# Patient Record
Sex: Female | Born: 1975 | Race: Black or African American | Hispanic: No | Marital: Single | Smoking: Never smoker
Health system: Southern US, Community
[De-identification: ages and names within clinical notes are randomized; demographics above are authoritative.]

## PROBLEM LIST (undated history)

## (undated) HISTORY — PX: ABDOMINAL HYSTERECTOMY: SHX81

---

## 2000-12-30 ENCOUNTER — Encounter: Payer: Self-pay | Admitting: Surgery

## 2000-12-30 ENCOUNTER — Encounter: Payer: Self-pay | Admitting: *Deleted

## 2000-12-30 ENCOUNTER — Inpatient Hospital Stay (HOSPITAL_COMMUNITY): Admission: AC | Admit: 2000-12-30 | Discharge: 2001-01-05 | Payer: Self-pay

## 2001-02-04 ENCOUNTER — Encounter: Payer: Self-pay | Admitting: Orthopaedic Surgery

## 2001-02-04 ENCOUNTER — Emergency Department (HOSPITAL_COMMUNITY): Admission: EM | Admit: 2001-02-04 | Discharge: 2001-02-04 | Payer: Self-pay | Admitting: Emergency Medicine

## 2001-02-04 ENCOUNTER — Encounter: Admission: RE | Admit: 2001-02-04 | Discharge: 2001-02-04 | Payer: Self-pay | Admitting: Orthopaedic Surgery

## 2001-02-10 ENCOUNTER — Encounter: Admission: RE | Admit: 2001-02-10 | Discharge: 2001-03-11 | Payer: Self-pay | Admitting: Orthopaedic Surgery

## 2003-04-17 ENCOUNTER — Emergency Department (HOSPITAL_COMMUNITY): Admission: EM | Admit: 2003-04-17 | Discharge: 2003-04-17 | Payer: Self-pay

## 2004-06-12 ENCOUNTER — Other Ambulatory Visit: Admission: RE | Admit: 2004-06-12 | Discharge: 2004-06-12 | Payer: Self-pay | Admitting: Obstetrics and Gynecology

## 2007-08-05 ENCOUNTER — Emergency Department (HOSPITAL_COMMUNITY): Admission: EM | Admit: 2007-08-05 | Discharge: 2007-08-05 | Payer: Self-pay | Admitting: Emergency Medicine

## 2007-08-14 ENCOUNTER — Emergency Department (HOSPITAL_COMMUNITY): Admission: EM | Admit: 2007-08-14 | Discharge: 2007-08-14 | Payer: Self-pay | Admitting: Emergency Medicine

## 2007-11-22 ENCOUNTER — Emergency Department (HOSPITAL_COMMUNITY): Admission: EM | Admit: 2007-11-22 | Discharge: 2007-11-23 | Payer: Self-pay | Admitting: Emergency Medicine

## 2007-12-04 ENCOUNTER — Emergency Department (HOSPITAL_COMMUNITY): Admission: EM | Admit: 2007-12-04 | Discharge: 2007-12-04 | Payer: Self-pay | Admitting: Emergency Medicine

## 2007-12-11 ENCOUNTER — Emergency Department (HOSPITAL_COMMUNITY): Admission: EM | Admit: 2007-12-11 | Discharge: 2007-12-11 | Payer: Self-pay | Admitting: Emergency Medicine

## 2008-01-30 ENCOUNTER — Emergency Department (HOSPITAL_COMMUNITY): Admission: EM | Admit: 2008-01-30 | Discharge: 2008-01-30 | Payer: Self-pay | Admitting: Emergency Medicine

## 2008-07-05 ENCOUNTER — Ambulatory Visit (HOSPITAL_COMMUNITY): Admission: RE | Admit: 2008-07-05 | Discharge: 2008-07-05 | Payer: Self-pay | Admitting: Obstetrics and Gynecology

## 2008-07-07 ENCOUNTER — Encounter: Admission: RE | Admit: 2008-07-07 | Discharge: 2008-08-04 | Payer: Self-pay | Admitting: Obstetrics and Gynecology

## 2008-07-25 ENCOUNTER — Ambulatory Visit: Payer: Self-pay | Admitting: Obstetrics & Gynecology

## 2008-07-25 LAB — CONVERTED CEMR LAB
MCV: 91.9 fL (ref 78.0–100.0)
WBC: 10.4 10*3/uL (ref 4.0–10.5)

## 2008-08-02 ENCOUNTER — Ambulatory Visit: Payer: Self-pay | Admitting: Family Medicine

## 2008-08-02 ENCOUNTER — Inpatient Hospital Stay (HOSPITAL_COMMUNITY): Admission: AD | Admit: 2008-08-02 | Discharge: 2008-08-02 | Payer: Self-pay | Admitting: Family Medicine

## 2008-08-04 ENCOUNTER — Ambulatory Visit: Payer: Self-pay | Admitting: Family Medicine

## 2008-08-04 ENCOUNTER — Encounter: Payer: Self-pay | Admitting: Obstetrics & Gynecology

## 2008-08-04 ENCOUNTER — Ambulatory Visit (HOSPITAL_COMMUNITY): Admission: RE | Admit: 2008-08-04 | Discharge: 2008-08-04 | Payer: Self-pay | Admitting: Obstetrics & Gynecology

## 2008-08-04 LAB — CONVERTED CEMR LAB
Benzodiazepines.: NEGATIVE
Creatinine,U: 67.3 mg/dL
Methadone: NEGATIVE
Opiate Screen, Urine: POSITIVE — AB
Propoxyphene: NEGATIVE

## 2008-08-12 ENCOUNTER — Ambulatory Visit (HOSPITAL_COMMUNITY): Admission: RE | Admit: 2008-08-12 | Discharge: 2008-08-12 | Payer: Self-pay | Admitting: Family Medicine

## 2008-08-13 ENCOUNTER — Encounter: Payer: Self-pay | Admitting: Obstetrics & Gynecology

## 2008-08-18 ENCOUNTER — Ambulatory Visit (HOSPITAL_COMMUNITY): Admission: RE | Admit: 2008-08-18 | Discharge: 2008-08-18 | Payer: Self-pay | Admitting: Obstetrics & Gynecology

## 2008-08-18 ENCOUNTER — Ambulatory Visit: Payer: Self-pay | Admitting: Obstetrics & Gynecology

## 2008-08-19 ENCOUNTER — Ambulatory Visit: Payer: Self-pay | Admitting: Obstetrics & Gynecology

## 2008-08-21 ENCOUNTER — Ambulatory Visit: Payer: Self-pay | Admitting: Obstetrics & Gynecology

## 2008-08-21 ENCOUNTER — Inpatient Hospital Stay (HOSPITAL_COMMUNITY): Admission: AD | Admit: 2008-08-21 | Discharge: 2008-08-21 | Payer: Self-pay | Admitting: Obstetrics & Gynecology

## 2008-11-24 ENCOUNTER — Emergency Department (HOSPITAL_COMMUNITY): Admission: EM | Admit: 2008-11-24 | Discharge: 2008-11-24 | Payer: Self-pay | Admitting: Emergency Medicine

## 2008-12-01 ENCOUNTER — Emergency Department (HOSPITAL_COMMUNITY): Admission: EM | Admit: 2008-12-01 | Discharge: 2008-12-01 | Payer: Self-pay | Admitting: Emergency Medicine

## 2009-01-31 ENCOUNTER — Emergency Department (HOSPITAL_COMMUNITY): Admission: EM | Admit: 2009-01-31 | Discharge: 2009-01-31 | Payer: Self-pay | Admitting: Family Medicine

## 2009-03-29 ENCOUNTER — Inpatient Hospital Stay (HOSPITAL_COMMUNITY): Admission: AD | Admit: 2009-03-29 | Discharge: 2009-03-29 | Payer: Self-pay | Admitting: Obstetrics & Gynecology

## 2009-04-12 ENCOUNTER — Ambulatory Visit: Payer: Self-pay | Admitting: Obstetrics & Gynecology

## 2009-05-11 ENCOUNTER — Inpatient Hospital Stay (HOSPITAL_COMMUNITY): Admission: RE | Admit: 2009-05-11 | Discharge: 2009-05-13 | Payer: Self-pay | Admitting: Obstetrics & Gynecology

## 2009-05-11 ENCOUNTER — Ambulatory Visit: Payer: Self-pay | Admitting: Obstetrics & Gynecology

## 2009-05-11 ENCOUNTER — Encounter: Payer: Self-pay | Admitting: Obstetrics & Gynecology

## 2009-05-17 ENCOUNTER — Inpatient Hospital Stay (HOSPITAL_COMMUNITY): Admission: AD | Admit: 2009-05-17 | Discharge: 2009-05-17 | Payer: Self-pay | Admitting: Obstetrics and Gynecology

## 2009-07-12 ENCOUNTER — Ambulatory Visit: Payer: Self-pay | Admitting: Obstetrics and Gynecology

## 2009-10-26 ENCOUNTER — Ambulatory Visit: Payer: Self-pay | Admitting: Obstetrics and Gynecology

## 2009-11-22 ENCOUNTER — Ambulatory Visit: Payer: Self-pay | Admitting: Internal Medicine

## 2009-12-12 ENCOUNTER — Ambulatory Visit: Payer: Self-pay | Admitting: Internal Medicine

## 2009-12-12 LAB — CONVERTED CEMR LAB
AST: 14 units/L (ref 0–37)
Albumin: 4.1 g/dL (ref 3.5–5.2)
Alkaline Phosphatase: 42 units/L (ref 39–117)
Basophils Absolute: 0.1 10*3/uL (ref 0.0–0.1)
CO2: 20 meq/L (ref 19–32)
Cholesterol: 145 mg/dL (ref 0–200)
Eosinophils Absolute: 0.3 10*3/uL (ref 0.0–0.7)
Eosinophils Relative: 7 % — ABNORMAL HIGH (ref 0–5)
HDL: 62 mg/dL (ref >39)
Lymphocytes Relative: 35 % (ref 12–46)
Lymphs Abs: 1.3 10*3/uL (ref 0.7–4.0)
MCV: 91 fL (ref 78.0–100.0)
Monocytes Absolute: 0.4 10*3/uL (ref 0.1–1.0)
Monocytes Relative: 9 % (ref 3–12)
Neutro Abs: 1.7 10*3/uL (ref 1.7–7.7)
Platelets: 288 10*3/uL (ref 150–400)
RBC: 4.13 M/uL (ref 3.87–5.11)
TSH: 1.291 microintl units/mL (ref 0.350–4.500)
Total Bilirubin: 1.1 mg/dL (ref 0.3–1.2)
Total CHOL/HDL Ratio: 2.3
Total Protein: 6.8 g/dL (ref 6.0–8.3)
Triglycerides: 35 mg/dL (ref <150)

## 2010-07-06 ENCOUNTER — Emergency Department (HOSPITAL_COMMUNITY): Admission: EM | Admit: 2010-07-06 | Discharge: 2010-07-06 | Payer: Self-pay | Admitting: Emergency Medicine

## 2010-09-22 ENCOUNTER — Encounter: Payer: Self-pay | Admitting: Obstetrics and Gynecology

## 2010-09-23 ENCOUNTER — Encounter: Payer: Self-pay | Admitting: *Deleted

## 2010-09-23 ENCOUNTER — Encounter: Payer: Self-pay | Admitting: Obstetrics and Gynecology

## 2010-09-23 ENCOUNTER — Encounter: Payer: Self-pay | Admitting: Obstetrics

## 2010-10-11 ENCOUNTER — Other Ambulatory Visit: Payer: Self-pay | Admitting: Family Medicine

## 2010-10-11 ENCOUNTER — Ambulatory Visit
Admission: RE | Admit: 2010-10-11 | Discharge: 2010-10-11 | Disposition: A | Discharge status: Home / Self Care | Payer: Medicaid Other | Source: Ambulatory Visit | Attending: Family Medicine | Admitting: Family Medicine

## 2010-10-11 DIAGNOSIS — J209 Acute bronchitis, unspecified: Secondary | ICD-10-CM

## 2010-10-11 DIAGNOSIS — J019 Acute sinusitis, unspecified: Secondary | ICD-10-CM

## 2010-10-14 ENCOUNTER — Emergency Department (HOSPITAL_COMMUNITY)
Admission: EM | Admit: 2010-10-14 | Discharge: 2010-10-14 | Disposition: A | Discharge status: Home / Self Care | Payer: Medicaid Other | Attending: Emergency Medicine | Admitting: Emergency Medicine

## 2010-10-14 DIAGNOSIS — J45909 Unspecified asthma, uncomplicated: Secondary | ICD-10-CM | POA: Insufficient documentation

## 2010-10-14 DIAGNOSIS — R093 Abnormal sputum: Secondary | ICD-10-CM | POA: Insufficient documentation

## 2010-10-14 DIAGNOSIS — R05 Cough: Secondary | ICD-10-CM | POA: Insufficient documentation

## 2010-10-14 DIAGNOSIS — R059 Cough, unspecified: Secondary | ICD-10-CM | POA: Insufficient documentation

## 2010-11-15 ENCOUNTER — Inpatient Hospital Stay: Admission: RE | Admit: 2010-11-15 | Payer: Medicaid Other | Source: Ambulatory Visit

## 2010-11-15 ENCOUNTER — Other Ambulatory Visit: Payer: Self-pay | Admitting: Otolaryngology

## 2010-11-15 DIAGNOSIS — J329 Chronic sinusitis, unspecified: Secondary | ICD-10-CM

## 2010-11-16 ENCOUNTER — Ambulatory Visit
Admission: RE | Admit: 2010-11-16 | Discharge: 2010-11-16 | Disposition: A | Discharge status: Home / Self Care | Payer: Medicaid Other | Source: Ambulatory Visit | Attending: Otolaryngology | Admitting: Otolaryngology

## 2010-11-16 DIAGNOSIS — J329 Chronic sinusitis, unspecified: Secondary | ICD-10-CM

## 2010-12-07 LAB — CBC
HCT: 24.7 % — ABNORMAL LOW (ref 36.0–46.0)
HCT: 27.2 % — ABNORMAL LOW (ref 36.0–46.0)
Hemoglobin: 11.4 g/dL — ABNORMAL LOW (ref 12.0–15.0)
Hemoglobin: 8.2 g/dL — ABNORMAL LOW (ref 12.0–15.0)
Hemoglobin: 9.1 g/dL — ABNORMAL LOW (ref 12.0–15.0)
MCHC: 33 g/dL (ref 30.0–36.0)
Platelets: 215 10*3/uL (ref 150–400)
Platelets: 259 10*3/uL (ref 150–400)
RDW: 13.2 % (ref 11.5–15.5)
WBC: 12.7 10*3/uL — ABNORMAL HIGH (ref 4.0–10.5)

## 2010-12-07 LAB — PREGNANCY, URINE: Preg Test, Ur: NEGATIVE

## 2010-12-09 LAB — COMPREHENSIVE METABOLIC PANEL
BUN: 14 mg/dL (ref 6–23)
CO2: 27 mEq/L (ref 19–32)
Calcium: 9.4 mg/dL (ref 8.4–10.5)
Creatinine, Ser: 0.88 mg/dL (ref 0.4–1.2)
GFR calc Af Amer: >60 mL/min (ref >60)
Glucose, Bld: 90 mg/dL (ref 70–99)

## 2010-12-09 LAB — URINALYSIS, ROUTINE W REFLEX MICROSCOPIC
Bilirubin Urine: NEGATIVE
Glucose, UA: NEGATIVE mg/dL
Hgb urine dipstick: NEGATIVE
Specific Gravity, Urine: >1.03 — ABNORMAL HIGH (ref 1.005–1.030)
Urobilinogen, UA: 0.2 mg/dL (ref 0.0–1.0)

## 2010-12-09 LAB — CBC
HCT: 34.7 % — ABNORMAL LOW (ref 36.0–46.0)
Hemoglobin: 11.6 g/dL — ABNORMAL LOW (ref 12.0–15.0)
MCHC: 33.5 g/dL (ref 30.0–36.0)
MCV: 88.8 fL (ref 78.0–100.0)
RBC: 3.91 MIL/uL (ref 3.87–5.11)

## 2010-12-13 ENCOUNTER — Ambulatory Visit: Payer: No Typology Code available for payment source | Admitting: Physical Therapy

## 2010-12-18 ENCOUNTER — Ambulatory Visit: Payer: No Typology Code available for payment source | Attending: Orthopedic Surgery | Admitting: Physical Therapy

## 2010-12-18 DIAGNOSIS — IMO0001 Reserved for inherently not codable concepts without codable children: Secondary | ICD-10-CM | POA: Insufficient documentation

## 2010-12-18 DIAGNOSIS — M25676 Stiffness of unspecified foot, not elsewhere classified: Secondary | ICD-10-CM | POA: Insufficient documentation

## 2010-12-18 DIAGNOSIS — M25579 Pain in unspecified ankle and joints of unspecified foot: Secondary | ICD-10-CM | POA: Insufficient documentation

## 2010-12-18 DIAGNOSIS — M25673 Stiffness of unspecified ankle, not elsewhere classified: Secondary | ICD-10-CM | POA: Insufficient documentation

## 2010-12-31 ENCOUNTER — Ambulatory Visit: Payer: No Typology Code available for payment source | Admitting: Physical Therapy

## 2011-01-02 ENCOUNTER — Ambulatory Visit: Payer: No Typology Code available for payment source | Attending: Orthopedic Surgery | Admitting: Physical Therapy

## 2011-01-02 DIAGNOSIS — M25676 Stiffness of unspecified foot, not elsewhere classified: Secondary | ICD-10-CM | POA: Insufficient documentation

## 2011-01-02 DIAGNOSIS — M25673 Stiffness of unspecified ankle, not elsewhere classified: Secondary | ICD-10-CM | POA: Insufficient documentation

## 2011-01-02 DIAGNOSIS — M25579 Pain in unspecified ankle and joints of unspecified foot: Secondary | ICD-10-CM | POA: Insufficient documentation

## 2011-01-02 DIAGNOSIS — IMO0001 Reserved for inherently not codable concepts without codable children: Secondary | ICD-10-CM | POA: Insufficient documentation

## 2011-01-07 ENCOUNTER — Ambulatory Visit: Payer: No Typology Code available for payment source | Admitting: Physical Therapy

## 2011-01-09 ENCOUNTER — Ambulatory Visit: Payer: No Typology Code available for payment source | Admitting: Physical Therapy

## 2011-01-14 ENCOUNTER — Ambulatory Visit: Payer: No Typology Code available for payment source | Admitting: Physical Therapy

## 2011-01-15 NOTE — Group Therapy Note (Signed)
NAME:  Jenna Aguirre, Jenna Aguirre            ACCOUNT NO.:  [id number]   MEDICAL RECORD NO.:  [id number]          PATIENT TYPE:  WOC   LOCATION:  [facility]                   FACILITY:  [facility]   PHYSICIAN:  Allie Bossier, MD        DATE OF BIRTH:  February 07, 1976   DATE OF SERVICE:                                  CLINIC NOTE   Jenna Aguirre is a 35 year old gravida 2, para 1, abortus 1, who is here to  schedule an abdominal hysterectomy.  She has a longstanding history of  large uterine fibroids and recently completed a pregnancy via an  emergency C-section at [facility].  Please note at that time during her  pregnancy, she was on chronic narcotic use for the pain from her fibroid  and she describes since the baby delivery chronic pelvic pain and  painful periods.   PAST MEDICAL HISTORY:  Anemia, asthma, and fibroids.   REVIEW OF SYSTEMS:  Noncontributory.   SURGERY:  She has a C-section with a vertical skin incision.  Please  note that she would like a transverse skin incision with her  hysterectomy.  She had a D and C, and tubal ligation, and laparoscopy in  1999.   No latex allergies.   DRUG ALLERGIES:  AMOXICILLIN and INDOMETHACIN.   FAMILY HISTORY:  Negative for breast, GYN, and colon malignancies.   SOCIAL HISTORY:  Negative as well.   PHYSICAL EXAMINATION:  VITAL SIGNS:  She is 5 feet 9, her weight is 162  pounds, blood pressure is 121/77, and her pulse is 110.  GENERAL:  She is afebrile.  HEENT:  Normal heart.  Regular rate and rhythm.  LUNGS:  Clear to auscultation bilaterally.  ABDOMEN:  Benign.  Her uterus is palpable to 15-week size.   ASSESSMENT AND PLAN:  Symptomatic uterine fibroids with anemia.  The  patient has been counseled extensively about her options.  She is  adamant that she wishes to have a hysterectomy.  Per her request, I will  do a transverse incision.  Risks of surgery were explained and she  accepted this.      Allie Bossier, MD    MCD/MEDQ  D:  04/12/2009  T:   04/13/2009  Job:  [id number]

## 2011-01-16 ENCOUNTER — Ambulatory Visit: Payer: No Typology Code available for payment source | Admitting: Physical Therapy
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
## 2011-01-21 ENCOUNTER — Ambulatory Visit: Payer: No Typology Code available for payment source | Admitting: Physical Therapy

## 2011-01-23 ENCOUNTER — Encounter: Payer: No Typology Code available for payment source | Admitting: Physical Therapy

## 2011-02-11 ENCOUNTER — Encounter: Payer: No Typology Code available for payment source | Admitting: Physical Therapy

## 2011-02-13 ENCOUNTER — Ambulatory Visit: Payer: No Typology Code available for payment source | Attending: Orthopedic Surgery | Admitting: Physical Therapy

## 2011-02-13 DIAGNOSIS — IMO0001 Reserved for inherently not codable concepts without codable children: Secondary | ICD-10-CM | POA: Insufficient documentation

## 2011-02-13 DIAGNOSIS — M25579 Pain in unspecified ankle and joints of unspecified foot: Secondary | ICD-10-CM | POA: Insufficient documentation

## 2011-02-13 DIAGNOSIS — M25673 Stiffness of unspecified ankle, not elsewhere classified: Secondary | ICD-10-CM | POA: Insufficient documentation

## 2011-02-13 DIAGNOSIS — M25676 Stiffness of unspecified foot, not elsewhere classified: Secondary | ICD-10-CM | POA: Insufficient documentation

## 2011-03-04 ENCOUNTER — Encounter: Payer: No Typology Code available for payment source | Admitting: Physical Therapy

## 2011-03-11 ENCOUNTER — Ambulatory Visit: Payer: No Typology Code available for payment source | Attending: Orthopedic Surgery | Admitting: Physical Therapy

## 2011-03-11 DIAGNOSIS — M25676 Stiffness of unspecified foot, not elsewhere classified: Secondary | ICD-10-CM | POA: Insufficient documentation

## 2011-03-11 DIAGNOSIS — M25673 Stiffness of unspecified ankle, not elsewhere classified: Secondary | ICD-10-CM | POA: Insufficient documentation

## 2011-03-11 DIAGNOSIS — M25579 Pain in unspecified ankle and joints of unspecified foot: Secondary | ICD-10-CM | POA: Insufficient documentation

## 2011-03-11 DIAGNOSIS — IMO0001 Reserved for inherently not codable concepts without codable children: Secondary | ICD-10-CM | POA: Insufficient documentation

## 2011-03-13 ENCOUNTER — Encounter: Payer: No Typology Code available for payment source | Admitting: Physical Therapy

## 2011-03-26 ENCOUNTER — Encounter: Payer: No Typology Code available for payment source | Admitting: Physical Therapy

## 2011-03-29 ENCOUNTER — Encounter: Payer: No Typology Code available for payment source | Admitting: Physical Therapy

## 2011-04-25 ENCOUNTER — Emergency Department (HOSPITAL_COMMUNITY)
Admission: EM | Admit: 2011-04-25 | Discharge: 2011-04-25 | Disposition: A | Discharge status: Home / Self Care | Payer: Medicaid Other | Attending: Emergency Medicine | Admitting: Emergency Medicine

## 2011-04-25 DIAGNOSIS — M25579 Pain in unspecified ankle and joints of unspecified foot: Secondary | ICD-10-CM | POA: Insufficient documentation

## 2011-04-25 DIAGNOSIS — J45909 Unspecified asthma, uncomplicated: Secondary | ICD-10-CM | POA: Insufficient documentation

## 2011-06-05 LAB — POCT URINALYSIS DIP (DEVICE)
Ketones, ur: NEGATIVE
Protein, ur: NEGATIVE
Specific Gravity, Urine: 1.015
pH: 7

## 2011-06-07 LAB — URINALYSIS, ROUTINE W REFLEX MICROSCOPIC
Glucose, UA: NEGATIVE mg/dL
Hgb urine dipstick: NEGATIVE
Protein, ur: NEGATIVE mg/dL

## 2011-06-07 LAB — POCT URINALYSIS DIP (DEVICE)
Glucose, UA: NEGATIVE mg/dL
Hgb urine dipstick: NEGATIVE
Hgb urine dipstick: NEGATIVE
Ketones, ur: NEGATIVE mg/dL
Protein, ur: NEGATIVE mg/dL
Specific Gravity, Urine: 1.01 (ref 1.005–1.030)
Specific Gravity, Urine: 1.015 (ref 1.005–1.030)
Urobilinogen, UA: 0.2 mg/dL (ref 0.0–1.0)
pH: 7 (ref 5.0–8.0)

## 2011-06-07 LAB — URINE MICROSCOPIC-ADD ON

## 2011-06-07 LAB — FETAL FIBRONECTIN: Fetal Fibronectin: NEGATIVE

## 2011-11-20 ENCOUNTER — Encounter (HOSPITAL_COMMUNITY): Payer: Self-pay | Admitting: *Deleted

## 2011-11-20 ENCOUNTER — Emergency Department (HOSPITAL_COMMUNITY)
Admission: EM | Admit: 2011-11-20 | Discharge: 2011-11-20 | Disposition: A | Discharge status: Home / Self Care | Payer: Medicaid Other | Attending: Emergency Medicine | Admitting: Emergency Medicine

## 2011-11-20 ENCOUNTER — Emergency Department (HOSPITAL_COMMUNITY): Payer: Medicaid Other

## 2011-11-20 ENCOUNTER — Other Ambulatory Visit: Payer: Self-pay

## 2011-11-20 DIAGNOSIS — R0602 Shortness of breath: Secondary | ICD-10-CM | POA: Insufficient documentation

## 2011-11-20 DIAGNOSIS — J45901 Unspecified asthma with (acute) exacerbation: Secondary | ICD-10-CM | POA: Insufficient documentation

## 2011-11-20 DIAGNOSIS — R05 Cough: Secondary | ICD-10-CM | POA: Insufficient documentation

## 2011-11-20 DIAGNOSIS — R059 Cough, unspecified: Secondary | ICD-10-CM | POA: Insufficient documentation

## 2011-11-20 MED ORDER — AZITHROMYCIN 250 MG PO TABS: 250.0000 mg | ORAL_TABLET | Freq: Every day | ORAL | Status: AC

## 2011-11-20 MED ORDER — ALBUTEROL SULFATE (5 MG/ML) 0.5% IN NEBU
2.5000 mg | INHALATION_SOLUTION | Freq: Once | RESPIRATORY_TRACT | Status: AC
  Administered 2011-11-20: 2.5 mg via RESPIRATORY_TRACT
  Filled 2011-11-20: qty 0.5

## 2011-11-20 MED ORDER — ALBUTEROL SULFATE (5 MG/ML) 0.5% IN NEBU
5.0000 mg | INHALATION_SOLUTION | Freq: Once | RESPIRATORY_TRACT | Status: AC
  Administered 2011-11-20: 5 mg via RESPIRATORY_TRACT
  Filled 2011-11-20: qty 1

## 2011-11-20 MED ORDER — METHYLPREDNISOLONE SODIUM SUCC 125 MG IJ SOLR
125.0000 mg | INTRAMUSCULAR | Status: AC
  Administered 2011-11-20: 125 mg via INTRAMUSCULAR
  Filled 2011-11-20: qty 2

## 2011-11-20 MED ORDER — PREDNISONE 10 MG PO TABS: 50.0000 mg | ORAL_TABLET | Freq: Every day | ORAL | Status: DC

## 2011-11-20 MED ORDER — IPRATROPIUM BROMIDE 0.02 % IN SOLN
0.5000 mg | RESPIRATORY_TRACT | Status: AC
  Administered 2011-11-20: 0.5 mg via RESPIRATORY_TRACT
  Filled 2011-11-20: qty 2.5

## 2011-11-20 MED ORDER — ALBUTEROL (5 MG/ML) CONTINUOUS INHALATION SOLN
10.0000 mg/h | INHALATION_SOLUTION | RESPIRATORY_TRACT | Status: AC
  Administered 2011-11-20: 10 mg/h via RESPIRATORY_TRACT
  Filled 2011-11-20: qty 20

## 2011-11-20 NOTE — ED Notes (Signed)
Assumed care of pt.  No distress noted.  Pt very angry and agitated-reports that her breathing is not better.  Reports that the shot is not working and that it hurts still.  Pt reassured.  Expiratory wheezing noted in upper fields.  No acute respiratory distress noted.  Pt speaking in complete sentences.  Cap refill <3 seconds.

## 2011-11-20 NOTE — ED Notes (Addendum)
Pt wheezing, noted to be very anxious, O2 saturation 100%. Georgie Chard PAC was made aware

## 2011-11-20 NOTE — ED Notes (Signed)
Pt breathing easier about neb tx. Decreased wheezing noted.

## 2011-11-20 NOTE — ED Notes (Signed)
Called patient in waiting room to re-assess vitals; no answer 

## 2011-11-20 NOTE — ED Notes (Signed)
Pt reports 3 day hx of worsening sob and cough, pt with hx of asthma. Pt reports multiple uses of rescue inhaler without relief.

## 2011-11-20 NOTE — Discharge Instructions (Signed)
This is likely an acute exacerbation of your asthma. You've been given prescriptions for Zithromax and a steroid. Use your inhaler every 4 hours for the next 24 to 48 hours. Keep your appointment with Dr. Willa Rough as scheduled on Friday. Return to the ER with worsening shortness of breath, high fever, or other worrisome symptoms.  RESOURCE GUIDE  Dental Problems  Patients with Medicaid: [facility] [phone] [address]                                           [phone] [address] Phone:  [phone]                                                  Phone:  [phone]  If unable to pay or uninsured, contact:  [facility] or [facility]. to become qualified for the adult dental clinic.  Chronic Pain Problems Contact [facility]  [phone] Patients need to be referred by their primary care doctor.  Insufficient Money for Medicine Contact United Way:  call "211" or [facility] 613-572-5545.  No Primary Care Doctor Call [facility]  432-679-2442 Other agencies that provide inexpensive medical care    [facility]  5104012997    [facility]  [phone]    [facility]  219-678-1997    [facility]  [phone]    [facility]  765-169-0676    [facility]  [phone]  Psychological Services [facility]  [phone] [facility] Services  [phone] [facility]   [phone] (emergency services [phone])  Substance Abuse Resources [facility]  ([phone] [facility] 352-684-2667 [facility] [phone] Floydene Flock [phone] Residential & Outpatient Substance Abuse Program  505-175-1453  Abuse/Neglect [facility] Child Abuse Hotline (628) 877-1686 [facility] Child Abuse Hotline 540-687-2935 (After Hours)  Emergency Shelter [facility] [phone]  Maternity  Homes Room at the [place] [facility] 3608637843 [facility] [phone]  MRSA Hotline #:   (506)124-9258    [facility] Resources  [facility]     United Way                          [facility] Dept. [address] [place]                       [address]      [address]  [place]                                                Cristobal Goldmann Phone:  [phone]  Phone:  [phone]                 Phone:  [phone]  [facility] Mental [facility] Phone:  [phone]  [facility] Child Abuse Hotline 951 060 8066 (985)365-0363 (After Hours)  Asthma, Acute Bronchospasm Your exam shows you have asthma, or acute bronchospasm that acts like asthma. Bronchospasm means your air passages become narrowed. These conditions are due to inflammation and airway spasm that cause narrowing of the bronchial tubes in the lungs. This causes you to have wheezing and shortness of breath. CAUSES  Respiratory infections and allergies most often bring on these attacks. Smoking, air pollution, cold air, emotional upsets, and vigorous exercise can also bring them on.  TREATMENT   Treatment is aimed at making the narrowed airways larger. Mild asthma/bronchospasm is usually controlled with inhaled medicines. Albuterol is a common medicine that you breathe in to open spastic or narrowed airways. Some trade names for albuterol are Ventolin or Proventil. Steroid medicine is also used to reduce the inflammation when an attack is moderate or severe. Antibiotics (medications used to kill germs) are only used if a bacterial infection is present.   If you are pregnant and need to use Albuterol (Ventolin or Proventil), you can expect the baby to move more than usual shortly after the medicine is used.  HOME CARE INSTRUCTIONS   Rest.   Drink plenty of liquids. This helps the mucus  to remain thin and easily coughed up. Do not use caffeine or alcohol.   Do not smoke. Avoid being exposed to second-hand smoke.   You play a critical role in keeping yourself in good health. Avoid exposure to things that cause you to wheeze. Avoid exposure to things that cause you to have breathing problems. Keep your medications up-to-date and available. Carefully follow your doctor's treatment plan.   When pollen or pollution is bad, keep windows closed and use an air conditioner go to places with air conditioning. If you are allergic to furry pets or birds, find new homes for them or keep them outside.   Take your medicine exactly as prescribed.   Asthma requires careful medical attention. See your caregiver for follow-up as advised. If you are more than [redacted] weeks pregnant and you were prescribed any new medications, let your Obstetrician know about the visit and how you are doing. Arrange a recheck.  SEEK IMMEDIATE MEDICAL CARE IF:   You are getting worse.   You have trouble breathing. If severe, call 911.   You develop chest pain or discomfort.   You are throwing up or not drinking fluids.   You are not getting better within 24 hours.   You are coughing up yellow, green, brown, or bloody sputum.   You develop a fever over 102 F (38.9 C).   You have trouble swallowing.  MAKE SURE YOU:   Understand these instructions.   Will watch your condition.   Will get help right away if you are not doing well or get worse.  Document Released: 12/04/2006 Document Revised: 08/08/2011 Document Reviewed: 08/03/2007 [facility] Patient Information 2012 [place], [place].

## 2011-11-20 NOTE — ED Notes (Signed)
Pt receiving hour long treatment.  No distress noted.  Wheezing improved with treatment.  Pt denies needs at this time.  Calm and cooperative.

## 2011-11-20 NOTE — ED Notes (Signed)
Have spoken with pt several times regarding her results and poc. Pt states her needs are above everyone elses and she would like to speak to someone else. Pt has spoken with pt advocates.

## 2011-11-20 NOTE — ED Provider Notes (Cosign Needed)
9:07 PM Pt came in with 3 day history of wheezing, no relief with her inhaler.  Initial exam per Ms Mayford Knife showed wheezing and rate 110.  Rx with Solumedrol and Albuterol nebulizer treatment.  Now she feels palpitation.  Exam now shows tachycardia of 150.  Will get EKG to further evaluate.   Date: 11/21/2011  Rate: 143  Rhythm: sinus tachycardia  QRS Axis: left  Intervals: normal  ST/T Wave abnormalities: normal  Conduction Disutrbances:nonspecific intraventricular conduction delay  Narrative Interpretation: Abnormal EKG  Old EKG Reviewed: changes noted--rate is faster today, 143 vs 92 on 10/14/2010.  Pt's rate slowed, sinus tach appeared to be the result of the albuterol nebulizer treatments.  Released. Carleene Cooper III M.D.  Carleene Cooper III, MD 11/22/11 1300

## 2011-11-20 NOTE — ED Notes (Signed)
Pt reports ease of breathing.  No wheezing noted.  Denies pain.  Denies needs at this time.  Updated on POC-waiting for HR to decrease and then will be discharged home

## 2011-11-20 NOTE — ED Notes (Signed)
Pt received to POD 4 with c/o SOB, cough x 3 days greenish phlegm and vomiting. Pt claimed that she has been using her Albuterol inhaler but she is not getting better. Pt is NAD.

## 2011-11-20 NOTE — ED Provider Notes (Signed)
History     CSN: [id number]  Arrival date & time 11/20/11  1350   First MD Initiated Contact with Patient 11/20/11 1825      Chief Complaint  Patient presents with  . Shortness of Breath  . Cough    (Consider location/radiation/quality/duration/timing/severity/associated sxs/prior treatment) HPI Hx obtained from pt and previous chart; hx directly from pt limited. 36yo F with hx asthma, which was apparently diagnosed after her pregnancy, presents with dyspnea and productive cough for the past several days.  Pt is not cooperative with exam and unwilling to offer much hx about what brought her in, simply stating to me, "I'm having an asthma attack." When asked further questions, she refuses to answer.  She does state that she "wants something done about my asthma." Per RN, she was apparently started on Symbicort which she was unable to afford as it was not covered by her insurance.   Past Medical History  Diagnosis Date  . Asthma     Past Surgical History  Procedure Date  . Abdominal hysterectomy     History reviewed. No pertinent family history.  History  Substance Use Topics  . Smoking status: Never Smoker   . Smokeless tobacco: Not on file  . Alcohol Use: No    OB History    Grav Para Term Preterm Abortions TAB SAB Ect Mult Living                  Review of Systems  Unable to perform ROS: Other    Allergies  Amoxicillin and Neurontin  Home Medications   Current Outpatient Rx  Name Route Sig Dispense Refill  . ALBUTEROL SULFATE HFA 108 (90 BASE) MCG/ACT IN AERS Inhalation Inhale 2 puffs into the lungs every 4 (four) hours as needed. For shortness of breath    . DIPHENHYDRAMINE HCL 25 MG PO TABS Oral Take 25 mg by mouth every 4 (four) hours as needed. For allergies    . LORATADINE 10 MG PO TABS Oral Take 10 mg by mouth daily.      BP 120/85  Pulse 108  Temp(Src) 98.3 F (36.8 C) (Oral)  Resp 22  SpO2 100%  Physical Exam  Nursing note and vitals  reviewed. Constitutional: She appears well-developed and well-nourished. No distress.       Pt in no acute distress. Speaking in complete sentences. 96-100% sat on RA.  HENT:  Head: Normocephalic and atraumatic.  Cardiovascular: Regular rhythm and normal heart sounds.  Exam reveals no gallop and no friction rub.   No murmur heard.      tachycardic  Pulmonary/Chest: Effort normal. She has wheezes.       Expiratory wheezes throughout  Musculoskeletal: Normal range of motion.  Neurological: She is alert.  Skin: Skin is warm and dry. She is not diaphoretic.  Psychiatric: Her mood appears anxious. She is agitated. She is noncommunicative.       Refuses to make eye contact    ED Course  Procedures (including critical care time)  Labs Reviewed - No data to display Dg Chest 2 View  11/20/2011  *RADIOLOGY REPORT*  Clinical Data: Cough, shortness of breath, nausea and vomiting.  CHEST - 2 VIEW  Comparison: 10/11/2010.  Findings: Trachea is midline.  Heart size normal.  Lungs are clear. No pleural fluid.  Mild dextroconvex scoliosis.  IMPRESSION: No acute findings.  Original Report Authenticated By: Reyes Ivan, M.D.     1. Asthma exacerbation       MDM  Pt with worsening cough, shortness of breath over the past several days. She was treated here with 2 alb/atrovent nebs, 1 continuous neb and solumedrol IM. Lung sounds markedly improved on exam. She was noted to be tachycardic as high as 150 immediately after continuous neb, which I attribute to likely effect of albuterol. ECG was performed which showed sinus tach. She was monitored for some time after receiving neb and HR dropped.  Likely asthma exacerbation. Will tx with burst of steroids, Zpak. Pt has appt with her allergist on Fri which she was instructed to keep. Return precautions discussed.   Medical screening examination/treatment/procedure(s) were conducted as a shared visit with non-physician practitioner(s) and myself.  I  personally evaluated the patient during the encounter   9:07 PM Pt came in with 3 day history of wheezing, no relief with her inhaler.  Initial exam per Ms Mayford Knife showed wheezing and rate 110.  Rx with Solumedrol and Albuterol nebulizer treatment.  Now she feels palpitation.  Exam now shows tachycardia of 150.  Will get EKG to further evaluate.   Date: 11/21/2011  Rate: 143  Rhythm: sinus tachycardia  QRS Axis: left  Intervals: normal  ST/T Wave abnormalities: normal  Conduction Disutrbances:nonspecific intraventricular conduction delay  Narrative Interpretation: Abnormal EKG  Old EKG Reviewed: changes noted--rate is faster today, 143 vs 92 on 10/14/2010.  Pt's rate slowed, sinus tach appeared to be the result of the albuterol nebulizer treatments.  Released. Carleene Cooper III M.D.    Grant Fontana, [place] 11/20/11 2311  Carleene Cooper III, MD 11/22/11 1302

## 2011-11-20 NOTE — ED Notes (Signed)
Pt called for reassessment of vital signs with no answer

## 2013-03-02 ENCOUNTER — Emergency Department (HOSPITAL_COMMUNITY)
Admission: EM | Admit: 2013-03-02 | Discharge: 2013-03-02 | Disposition: A | Discharge status: Home / Self Care | Payer: Medicaid Other | Attending: Emergency Medicine | Admitting: Emergency Medicine

## 2013-03-02 ENCOUNTER — Encounter (HOSPITAL_COMMUNITY): Payer: Self-pay | Admitting: Emergency Medicine

## 2013-03-02 DIAGNOSIS — S6990XA Unspecified injury of unspecified wrist, hand and finger(s), initial encounter: Secondary | ICD-10-CM | POA: Insufficient documentation

## 2013-03-02 DIAGNOSIS — S8990XA Unspecified injury of unspecified lower leg, initial encounter: Secondary | ICD-10-CM | POA: Insufficient documentation

## 2013-03-02 DIAGNOSIS — J45909 Unspecified asthma, uncomplicated: Secondary | ICD-10-CM | POA: Insufficient documentation

## 2013-03-02 DIAGNOSIS — Z79899 Other long term (current) drug therapy: Secondary | ICD-10-CM | POA: Insufficient documentation

## 2013-03-02 DIAGNOSIS — S59909A Unspecified injury of unspecified elbow, initial encounter: Secondary | ICD-10-CM | POA: Insufficient documentation

## 2013-03-02 DIAGNOSIS — Z880 Allergy status to penicillin: Secondary | ICD-10-CM | POA: Insufficient documentation

## 2013-03-02 DIAGNOSIS — G8929 Other chronic pain: Secondary | ICD-10-CM | POA: Insufficient documentation

## 2013-03-02 NOTE — ED Notes (Signed)
[place] [employer] at bedside.  
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
## 2013-03-02 NOTE — ED Notes (Signed)
[employer] at bedside 
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
## 2013-03-04 NOTE — ED Provider Notes (Signed)
Medical screening examination/treatment/procedure(s) were performed by non-physician practitioner and as supervising physician I was immediately available for consultation/collaboration.    Christopher J. Pollina, MD 03/04/13 0707 

## 2013-08-13 ENCOUNTER — Encounter (HOSPITAL_COMMUNITY): Payer: Self-pay | Admitting: Emergency Medicine

## 2013-08-13 ENCOUNTER — Emergency Department (HOSPITAL_COMMUNITY)
Admission: EM | Admit: 2013-08-13 | Discharge: 2013-08-13 | Disposition: A | Discharge status: Home / Self Care | Payer: Medicaid Other | Source: Home / Self Care | Attending: Family Medicine | Admitting: Family Medicine

## 2013-08-13 ENCOUNTER — Other Ambulatory Visit (HOSPITAL_COMMUNITY)
Admission: RE | Admit: 2013-08-13 | Discharge: 2013-08-13 | Disposition: A | Discharge status: Home / Self Care | Payer: Medicaid Other | Source: Ambulatory Visit | Attending: Family Medicine | Admitting: Family Medicine

## 2013-08-13 DIAGNOSIS — A499 Bacterial infection, unspecified: Secondary | ICD-10-CM

## 2013-08-13 DIAGNOSIS — N760 Acute vaginitis: Secondary | ICD-10-CM

## 2013-08-13 DIAGNOSIS — B9689 Other specified bacterial agents as the cause of diseases classified elsewhere: Secondary | ICD-10-CM

## 2013-08-13 DIAGNOSIS — Z113 Encounter for screening for infections with a predominantly sexual mode of transmission: Secondary | ICD-10-CM | POA: Insufficient documentation

## 2013-08-13 LAB — POCT URINALYSIS DIP (DEVICE)
Bilirubin Urine: NEGATIVE
Glucose, UA: NEGATIVE mg/dL
Hgb urine dipstick: NEGATIVE
Nitrite: NEGATIVE

## 2013-08-13 LAB — POCT PREGNANCY, URINE: Preg Test, Ur: NEGATIVE

## 2013-08-13 MED ORDER — METRONIDAZOLE 500 MG PO TABS: 500.0000 mg | ORAL_TABLET | Freq: Two times a day (BID) | ORAL | Status: DC

## 2013-08-13 NOTE — ED Provider Notes (Signed)
Medical screening examination/treatment/procedure(s) were performed by resident physician or non-physician practitioner and as supervising physician I was immediately available for consultation/collaboration.   Barkley Bruns MD.   Linna Hoff, MD 08/13/13 2037
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
## 2013-08-13 NOTE — ED Provider Notes (Signed)
CSN: [id number]     Arrival date & time 08/13/13  1823 History   First MD Initiated Contact with Patient 08/13/13 1901     Chief Complaint  Patient presents with  . Exposure to STD   (Consider location/radiation/quality/duration/timing/severity/associated sxs/prior Treatment) Patient is a 37 y.o. female presenting with vaginal discharge. The history is provided by the patient. No language interpreter was used.  Vaginal Discharge Quality:  White Severity:  Moderate Onset quality:  Sudden Timing:  Constant Progression:  Worsening Chronicity:  New Relieved by:  Nothing Ineffective treatments:  None tried Pt concerned about std.    Past Medical History  Diagnosis Date  . Asthma    Past Surgical History  Procedure Laterality Date  . Abdominal hysterectomy     History reviewed. No pertinent family history. History  Substance Use Topics  . Smoking status: Never Smoker   . Smokeless tobacco: Not on file  . Alcohol Use: No   OB History   Grav Para Term Preterm Abortions TAB SAB Ect Mult Living                 Review of Systems  Genitourinary: Positive for vaginal discharge and vaginal pain.  All other systems reviewed and are negative.    Allergies  Amoxicillin; Neurontin; and Penicillins  Home Medications   Current Outpatient Rx  Name  Route  Sig  Dispense  Refill  . albuterol (PROVENTIL HFA;VENTOLIN HFA) 108 (90 BASE) MCG/ACT inhaler   Inhalation   Inhale 2 puffs into the lungs every 4 (four) hours as needed. For shortness of breath         . budesonide-formoterol (SYMBICORT) 160-4.5 MCG/ACT inhaler   Inhalation   Inhale 2 puffs into the lungs 2 (two) times daily.         . diphenhydrAMINE (BENADRYL) 25 MG tablet   Oral   Take 25 mg by mouth every 4 (four) hours as needed. For allergies         . loratadine (CLARITIN) 10 MG tablet   Oral   Take 10 mg by mouth daily.          BP 142/88  Pulse 113  Temp(Src) 97.7 F (36.5 C) (Oral)  Resp 20   SpO2 95% Physical Exam  Nursing note and vitals reviewed. Constitutional: She is oriented to person, place, and time. She appears well-developed and well-nourished.  Eyes: Conjunctivae are normal. Pupils are equal, round, and reactive to light.  Neck: Normal range of motion. Neck supple.  Cardiovascular: Normal rate and normal heart sounds.   Pulmonary/Chest: Effort normal and breath sounds normal.  Abdominal: Soft.  Genitourinary: Vaginal discharge found.  Musculoskeletal: Normal range of motion.  Neurological: She is alert and oriented to person, place, and time. She has normal reflexes.  Skin: Skin is warm.  Psychiatric: She has a normal mood and affect.    ED Course  Procedures (including critical care time) Labs Review Labs Reviewed  POCT URINALYSIS DIP (DEVICE) - Abnormal; Notable for the following:    Ketones, ur 15 (*)    Leukocytes, UA SMALL (*)    All other components within normal limits  POCT PREGNANCY, URINE   Imaging Review No results found.  EKG Interpretation    Date/Time:    Ventricular Rate:    PR Interval:    QRS Duration:   QT Interval:    QTC Calculation:   R Axis:     Text Interpretation:  MDM   1. BV (bacterial vaginosis)       Elson Areas, PA-C 08/13/13 1924

## 2013-08-14 LAB — HIV ANTIBODY (ROUTINE TESTING W REFLEX): HIV: NONREACTIVE

## 2013-08-14 LAB — RPR: RPR Ser Ql: NONREACTIVE

## 2013-08-19 NOTE — ED Notes (Signed)
GC/Chlamydia neg., Affirm: Candida and Trich neg., Gardnerella pos., HIV/RPR, non-reactive.  Pt. adequately treated with Flagyl. Vassie Moselle 08/19/2013

## 2013-08-20 NOTE — ED Notes (Signed)
Pt  Phoned  Stating  yest  She   Had  An episode       Of  Numbness  r  Arm and  Hand  Which  Resolved         She  Denied  Any  Angioedema  She  Denied  Any  Rash      She  Reports    She  Is  Feeling  Better  Now   She  Requests   A  Note  Stating  She  Was  Seen  On that  Day  And  Documentation  She  Was   Placed  On  Flagyl      She  Was  Advised  She  Could  Present to  [facility] with id  And  Pick       It  Up     She  Understands

## 2013-08-23 ENCOUNTER — Encounter (HOSPITAL_COMMUNITY): Payer: Self-pay | Admitting: Emergency Medicine

## 2013-08-23 ENCOUNTER — Emergency Department (HOSPITAL_COMMUNITY)
Admission: EM | Admit: 2013-08-23 | Discharge: 2013-08-23 | Disposition: A | Discharge status: Home / Self Care | Payer: Medicaid Other | Attending: Emergency Medicine | Admitting: Emergency Medicine

## 2013-08-23 DIAGNOSIS — R6883 Chills (without fever): Secondary | ICD-10-CM | POA: Insufficient documentation

## 2013-08-23 DIAGNOSIS — J3489 Other specified disorders of nose and nasal sinuses: Secondary | ICD-10-CM | POA: Insufficient documentation

## 2013-08-23 DIAGNOSIS — Z79899 Other long term (current) drug therapy: Secondary | ICD-10-CM | POA: Insufficient documentation

## 2013-08-23 DIAGNOSIS — R110 Nausea: Secondary | ICD-10-CM | POA: Insufficient documentation

## 2013-08-23 DIAGNOSIS — J45909 Unspecified asthma, uncomplicated: Secondary | ICD-10-CM | POA: Insufficient documentation

## 2013-08-23 DIAGNOSIS — IMO0001 Reserved for inherently not codable concepts without codable children: Secondary | ICD-10-CM | POA: Insufficient documentation

## 2013-08-23 DIAGNOSIS — Z880 Allergy status to penicillin: Secondary | ICD-10-CM | POA: Insufficient documentation

## 2013-08-23 DIAGNOSIS — M791 Myalgia, unspecified site: Secondary | ICD-10-CM

## 2013-08-23 DIAGNOSIS — IMO0002 Reserved for concepts with insufficient information to code with codable children: Secondary | ICD-10-CM | POA: Insufficient documentation

## 2013-08-23 DIAGNOSIS — R5383 Other fatigue: Secondary | ICD-10-CM

## 2013-08-23 DIAGNOSIS — G90529 Complex regional pain syndrome I of unspecified lower limb: Secondary | ICD-10-CM | POA: Insufficient documentation

## 2013-08-23 DIAGNOSIS — R498 Other voice and resonance disorders: Secondary | ICD-10-CM | POA: Insufficient documentation

## 2013-08-23 DIAGNOSIS — R5381 Other malaise: Secondary | ICD-10-CM | POA: Insufficient documentation

## 2013-08-23 DIAGNOSIS — M79671 Pain in right foot: Secondary | ICD-10-CM

## 2013-08-23 LAB — CBC WITH DIFFERENTIAL/PLATELET
Basophils Relative: 1 % (ref 0–1)
Eosinophils Absolute: 0.4 10*3/uL (ref 0.0–0.7)
HCT: 38.6 % (ref 36.0–46.0)
Hemoglobin: 13.1 g/dL (ref 12.0–15.0)
Lymphocytes Relative: 46 % (ref 12–46)
Lymphs Abs: 2.6 10*3/uL (ref 0.7–4.0)
MCHC: 33.9 g/dL (ref 30.0–36.0)
Monocytes Relative: 9 % (ref 3–12)
Neutro Abs: 2.2 10*3/uL (ref 1.7–7.7)
Neutrophils Relative %: 38 % — ABNORMAL LOW (ref 43–77)
Platelets: 294 10*3/uL (ref 150–400)
RBC: 4.23 MIL/uL (ref 3.87–5.11)

## 2013-08-23 LAB — BASIC METABOLIC PANEL
BUN: 6 mg/dL (ref 6–23)
CO2: 23 mEq/L (ref 19–32)
Chloride: 101 mEq/L (ref 96–112)
GFR calc Af Amer: >90 mL/min (ref >90)
GFR calc non Af Amer: >90 mL/min (ref >90)
Potassium: 3.9 mEq/L (ref 3.5–5.1)
Sodium: 135 mEq/L (ref 135–145)

## 2013-08-23 MED ORDER — KETOROLAC TROMETHAMINE 60 MG/2ML IM SOLN
60.0000 mg | Freq: Once | INTRAMUSCULAR | Status: AC
  Administered 2013-08-23: 60 mg via INTRAMUSCULAR
  Filled 2013-08-23: qty 2

## 2013-08-23 MED ORDER — MELOXICAM 7.5 MG PO TABS: 7.5000 mg | ORAL_TABLET | Freq: Every day | ORAL | Status: DC

## 2013-08-23 NOTE — ED Provider Notes (Signed)
Medical screening examination/treatment/procedure(s) were performed by non-physician practitioner and as supervising physician I was immediately available for consultation/collaboration.  EKG Interpretation   None         Solana Coggin Y. Calahan Pak, MD 08/23/13 1555 
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
## 2013-08-23 NOTE — ED Notes (Signed)
Pt states her whole body hurts.  Pt states she did have sinus infection and has been under stress for school.  Reports nausea, recently treated with flagyl for vag infection which she had reaction too 4 days ago.

## 2013-11-04 ENCOUNTER — Other Ambulatory Visit: Payer: Self-pay | Admitting: Otolaryngology

## 2013-11-04 DIAGNOSIS — R0981 Nasal congestion: Secondary | ICD-10-CM

## 2013-11-15 ENCOUNTER — Ambulatory Visit
Admission: RE | Admit: 2013-11-15 | Discharge: 2013-11-15 | Disposition: A | Discharge status: Home / Self Care | Payer: Medicaid Other | Source: Ambulatory Visit | Attending: Otolaryngology | Admitting: Otolaryngology

## 2013-11-15 DIAGNOSIS — R0981 Nasal congestion: Secondary | ICD-10-CM

## 2013-11-19 ENCOUNTER — Ambulatory Visit: Payer: Medicaid Other | Attending: Otolaryngology | Admitting: Audiology

## 2013-11-19 DIAGNOSIS — H9190 Unspecified hearing loss, unspecified ear: Secondary | ICD-10-CM

## 2013-11-19 NOTE — Patient Instructions (Signed)
Follow up with Dr. Newman

## 2013-11-19 NOTE — Procedures (Signed)
   [facility] [address] [place], [place]  [id number] [phone]  AUDIOLOGICAL EVALUATION  Patient Name: Robina Adeigress S Spainhower  Medical Record Number:  [id number] Date of Birth:  03/22/1976     Date of Test:  11/19/2013  HISTORY:  Tameia, a 38 y.o. old was seen for audiological evaluation upon referral of Dillard Cannonhristopher Newman, MD.  Ms. Julien GirtMcDaniel reported a recent sinus infection (two months ago) with involvement of her left ear.  She was seen by Dr. Ezzard StandingNewman who reportedly performed a myringotomy on the left side two weeks ago.  She is here today for a follow up audiogram.    REPORT OF PAIN:  None  EVALUATION:   Air and bone conduction audiometry from 500Hz  - 8000Hz  utilizing insert  earphones revealed a mild sensory neural loss on the right side from 2000Hz  - 4000Hz  and a mild mixed loss on the left side from 2000Hz  - 4000Hz .    Speech reception thresholds were consistent with the pure tone results indicative of good test reliability.  Speech recognition testing was conducted in each ear independently, at a comfortable listening level (60dBHL) and indicated 100% and 100% in the right and left ears respectively.  Impedance audiometry was utilized and a Type A was obtained on the right side and a Type As was obtained on the left side suggesting normal middle ear functioning on the right side and somewhat restricted middle ear compliance on the left (although within normal limits)  Acoustic reflexes were tested from 500Hz  - 2000Hz  and were present on the right side and present on the left side.  Distortion Product Otoacoustic Emissions were tested from 2000Hz  - 10000Hz  and were weak or absent on the right side and were absent on the left side.    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  Tympanometry was normal  on the right side and slightly restricted on the left side.  Middle ear volume measurements were normal bilaterally.  Acoustic reflexes present.    RECOMMENDATIONS:    1. Follow up with Dr. Ezzard StandingNewman. 2. Because Yazlin has a weakened auditory system she is more at risk from damage of noise exposure than someone with a healthy auditory system and therefore should avoid excessive nose exposure if possible or certainly utilize ear protection.     Allyn Kennerebecca V. Pugh, Au.D. Doctor of Audiology CCC-A

## 2014-02-06 ENCOUNTER — Emergency Department (HOSPITAL_COMMUNITY): Payer: Medicaid Other

## 2014-02-06 ENCOUNTER — Encounter (HOSPITAL_COMMUNITY): Payer: Self-pay | Admitting: Emergency Medicine

## 2014-02-06 ENCOUNTER — Emergency Department (HOSPITAL_COMMUNITY)
Admission: EM | Admit: 2014-02-06 | Discharge: 2014-02-06 | Disposition: A | Discharge status: Home / Self Care | Payer: Self-pay | Attending: Emergency Medicine | Admitting: Emergency Medicine

## 2014-02-06 DIAGNOSIS — Z79899 Other long term (current) drug therapy: Secondary | ICD-10-CM | POA: Insufficient documentation

## 2014-02-06 DIAGNOSIS — J45901 Unspecified asthma with (acute) exacerbation: Secondary | ICD-10-CM | POA: Insufficient documentation

## 2014-02-06 DIAGNOSIS — IMO0002 Reserved for concepts with insufficient information to code with codable children: Secondary | ICD-10-CM | POA: Insufficient documentation

## 2014-02-06 DIAGNOSIS — Z880 Allergy status to penicillin: Secondary | ICD-10-CM | POA: Insufficient documentation

## 2014-02-06 LAB — CBC
HEMATOCRIT: 38.4 % (ref 36.0–46.0)
Hemoglobin: 12.5 g/dL (ref 12.0–15.0)
MCH: 30 pg (ref 26.0–34.0)
MCHC: 32.6 g/dL (ref 30.0–36.0)
MCV: 92.3 fL (ref 78.0–100.0)
Platelets: 280 10*3/uL (ref 150–400)
RBC: 4.16 MIL/uL (ref 3.87–5.11)
RDW: 13 % (ref 11.5–15.5)
WBC: 8.2 10*3/uL (ref 4.0–10.5)

## 2014-02-06 LAB — BASIC METABOLIC PANEL
BUN: 10 mg/dL (ref 6–23)
CALCIUM: 9.2 mg/dL (ref 8.4–10.5)
CO2: 23 mEq/L (ref 19–32)
CREATININE: 1.04 mg/dL (ref 0.50–1.10)
Chloride: 104 mEq/L (ref 96–112)
GFR, EST AFRICAN AMERICAN: 78 mL/min — AB (ref >90)
GFR, EST NON AFRICAN AMERICAN: 67 mL/min — AB (ref >90)
Glucose, Bld: 148 mg/dL — ABNORMAL HIGH (ref 70–99)
Potassium: 3.5 mEq/L — ABNORMAL LOW (ref 3.7–5.3)
Sodium: 142 mEq/L (ref 137–147)

## 2014-02-06 LAB — HCG, SERUM, QUALITATIVE: Preg, Serum: NEGATIVE

## 2014-02-06 MED ORDER — ALBUTEROL SULFATE (2.5 MG/3ML) 0.083% IN NEBU
5.0000 mg | INHALATION_SOLUTION | Freq: Once | RESPIRATORY_TRACT | Status: AC
  Administered 2014-02-06: 5 mg via RESPIRATORY_TRACT
  Filled 2014-02-06: qty 6

## 2014-02-06 MED ORDER — PREDNISONE 20 MG PO TABS: ORAL_TABLET | ORAL | Status: AC

## 2014-02-06 MED ORDER — IPRATROPIUM BROMIDE 0.02 % IN SOLN
0.5000 mg | Freq: Once | RESPIRATORY_TRACT | Status: AC
  Administered 2014-02-06: 0.5 mg via RESPIRATORY_TRACT
  Filled 2014-02-06: qty 2.5

## 2014-02-06 MED ORDER — PREDNISONE 20 MG PO TABS
60.0000 mg | ORAL_TABLET | Freq: Once | ORAL | Status: AC
  Administered 2014-02-06: 60 mg via ORAL
  Filled 2014-02-06: qty 3

## 2014-02-06 MED ORDER — SODIUM CHLORIDE 0.9 % IV BOLUS (SEPSIS)
1000.0000 mL | Freq: Once | INTRAVENOUS | Status: AC
  Administered 2014-02-06: 1000 mL via INTRAVENOUS

## 2014-02-06 MED ORDER — ALBUTEROL (5 MG/ML) CONTINUOUS INHALATION SOLN
20.0000 mg/h | INHALATION_SOLUTION | RESPIRATORY_TRACT | Status: DC
  Administered 2014-02-06: 20 mg/h via RESPIRATORY_TRACT
  Filled 2014-02-06: qty 20

## 2014-02-06 MED ORDER — DIPHENHYDRAMINE HCL 50 MG/ML IJ SOLN
50.0000 mg | Freq: Once | INTRAMUSCULAR | Status: AC
  Administered 2014-02-06: 50 mg via INTRAVENOUS
  Filled 2014-02-06: qty 1

## 2014-02-06 MED ORDER — EPINEPHRINE 0.3 MG/0.3ML IJ SOAJ
0.3000 mg | Freq: Once | INTRAMUSCULAR | Status: AC
  Administered 2014-02-06: 0.3 mg via INTRAMUSCULAR
  Filled 2014-02-06: qty 0.3

## 2014-02-06 NOTE — Discharge Instructions (Signed)
Bronchospasm, Adult A bronchospasm is when the tubes that carry air in and out of your lungs (airwarys) spasm or tighten. During a bronchospasm it is hard to breathe. This is because the airways get smaller. A bronchospasm can be triggered by:  Allergies. These may be to animals, pollen, food, or mold.  Infection. This is a common cause of bronchospasm.  Exercise.  Irritants. These include pollution, cigarette smoke, strong odors, aerosol sprays, and paint fumes.  Weather changes.  Stress.  Being emotional. HOME CARE   Always have a plan for getting help. Know when to call your doctor and local emergency services (911 in the [place] Know where you can get emergency care.  Only take medicines as told by your doctor.  If you were prescribed an inhaler or nebulizer machine, ask your doctor how to use it correctly. Always use a spacer with your inhaler if you were given one.  Stay calm during an attack. Try to relax and breathe more slowly.  Control your home environment:  Change your heating and air conditioning filter at least once a month.  Limit your use of fireplaces and wood stoves.  Do not  smoke. Do not  allow smoking in your home.  Avoid perfumes and fragrances.  Get rid of pests (such as roaches and mice) and their droppings.  Throw away plants if you see mold on them.  Keep your house clean and dust free.  Replace carpet with wood, tile, or vinyl flooring. Carpet can trap dander and dust.  Use allergy-proof pillows, mattress covers, and box spring covers.  Wash bed sheets and blankets every week in hot water. Dry them in a dryer.  Use blankets that are made of polyester or cotton.  Wash hands frequently. GET HELP IF:  You have muscle aches.  You have chest pain.  The thick spit you spit or cough up (sputum) changes from clear or white to yellow, green, gray, or bloody.  The thick spit you spit or cough up gets thicker.  There are problems that may be  related to the medicine you are given such as:  A rash.  Itching.  Swelling.  Trouble breathing. GET HELP RIGHT AWAY IF:  You feel you cannot breathe or catch your breath.  You cannot stop coughing.  Your treatment is not helping you breathe better. MAKE SURE YOU:   Understand these instructions.  Will watch your condition.  Will get help right away if you are not doing well or get worse. Document Released: 06/16/2009 Document Revised: 04/21/2013 Document Reviewed: 02/09/2013 [facility] Patient Information 2014 [place], [place]. You appear to have an upper respiratory infection (URI) or possible allergic reaction. An upper respiratory tract infection, or cold, is a viral infection of the air passages leading to the lungs. It is contagious and can be spread to others, especially during the first 3 or 4 days. It cannot be cured by antibiotics or other medicines. RETURN IMMEDIATELY IF you develop worse shortness of breath, confusion or altered mental status, a new rash, become dizzy, faint, or poorly responsive, or are unable to be cared for at home.

## 2014-02-06 NOTE — ED Notes (Signed)
Dr. Bednar at bedside. 

## 2014-02-06 NOTE — ED Notes (Signed)
Pt c/o productive cough and shortness of breath onset this morning. Pt reports that she is coughing up green secretions.

## 2014-02-06 NOTE — ED Notes (Signed)
Pt refused to go for chest xray. Stated she wanted help for condition before she went to xray

## 2014-02-06 NOTE — ED Notes (Signed)
Patient thinks she is having an allergic reaction. She does not know to what. She states she has been breathing like this all day. Expiratory/inspiratory wheezes noted thru out lung fields.

## 2014-02-06 NOTE — ED Provider Notes (Signed)
CSN: [id number]     Arrival date & time 02/06/14  1548 History   First MD Initiated Contact with Patient 02/06/14 1600     Chief Complaint  Patient presents with  . Cough  . Shortness of Breath     (Consider location/radiation/quality/duration/timing/severity/associated sxs/prior Treatment) HPI Several hours gradual onset cough wheezing shortness of breath using her albuterol inhaler multiple times with minimal if any improvement no fever no chest pain no abdominal pain no vomiting no confusion no syncope no trauma; her shortness breath is moderately severe. Past Medical History  Diagnosis Date  . Asthma    Past Surgical History  Procedure Laterality Date  . Abdominal hysterectomy    . Cesarean section     No family history on file. History  Substance Use Topics  . Smoking status: Never Smoker   . Smokeless tobacco: Not on file  . Alcohol Use: No   OB History   Grav Para Term Preterm Abortions TAB SAB Ect Mult Living                 Review of Systems  10 Systems reviewed and are negative for acute change except as noted in the HPI.  Allergies  Amoxicillin; Flagyl; Neurontin; and Penicillins  Home Medications   Prior to Admission medications   Medication Sig Start Date End Date Taking? Authorizing Provider  albuterol (PROVENTIL HFA;VENTOLIN HFA) 108 (90 BASE) MCG/ACT inhaler Inhale 2 puffs into the lungs every 4 (four) hours as needed. For shortness of breath   Yes Historical Provider, MD  budesonide-formoterol (SYMBICORT) 160-4.5 MCG/ACT inhaler Inhale 2 puffs into the lungs 2 (two) times daily.   Yes Historical Provider, MD  diphenhydrAMINE (BENADRYL) 25 MG tablet Take 25 mg by mouth every 4 (four) hours as needed. For allergies   Yes Historical Provider, MD  Fexofenadine HCl (MUCINEX ALLERGY PO) Take 1-2 tablets by mouth daily as needed (allergies).   Yes Historical Provider, MD  fluticasone (FLONASE) 50 MCG/ACT nasal spray Place 1 spray into both nostrils daily as  needed for allergies or rhinitis.   Yes Historical Provider, MD  OLIVE LEAF PO Take 1 capsule by mouth daily.   Yes Historical Provider, MD  TURMERIC PO Take 1 tablet by mouth 3 (three) times daily.   Yes Historical Provider, MD  VITAMIN E PO Take 1 capsule by mouth daily.   Yes Historical Provider, MD  predniSONE (DELTASONE) 20 MG tablet 2 tabs po daily x 4 days 02/06/14   Hurman Horn, MD   BP 114/71  Pulse 124  Resp 22  Wt 150 lb (68.04 kg)  SpO2 98% Physical Exam  Nursing note and vitals reviewed. Constitutional:  Awake, alert, nontoxic appearance.  HENT:  Head: Atraumatic.  Eyes: Right eye exhibits no discharge. Left eye exhibits no discharge.  Neck: Neck supple.  Cardiovascular: Regular rhythm.   No murmur heard. Tachycardic  Pulmonary/Chest: She is in respiratory distress. She has wheezes. She has no rales. She exhibits no tenderness.  Moderate rescue distress, patient speaks short sentences with moderate distress, pulse oximetry normal room air 95%, patient has tachypnea with diffuse wheezes with no retractions and mild accessory muscle usage  Abdominal: Soft. Bowel sounds are normal. She exhibits no distension. There is no tenderness. There is no rebound and no guarding.  Musculoskeletal: She exhibits no edema and no tenderness.  Baseline ROM, no obvious new focal weakness.  Neurological:  Mental status and motor strength appears baseline for patient and situation.  Skin: No  rash noted.  Psychiatric: She has a normal mood and affect.    ED Course  Procedures (including critical care time) Continuous neb 2 hours in ED; much better after arrived in moderately severe resp distress. Was given epinephrine and Benadryl as well since Pt had sensation of throat tightening without stridor or drooling and she thought she may be having an allergic reaction.  Speaking full sentences and barely wheezing with normal room air pulse ox after nebs in ED. Pt prefers discharge rather than  optional Obs.  CRITICAL CARE Performed by: Hurman HornJohn M Litha Lamartina Total critical care time: 45min Critical care time was exclusive of separately billable procedures and treating other patients. Critical care was necessary to treat or prevent imminent or life-threatening deterioration. Critical care was time spent personally by me on the following activities: development of treatment plan with patient and/or surrogate as well as nursing, discussions with consultants, evaluation of patient's response to treatment, examination of patient, obtaining history from patient or surrogate, ordering and performing treatments and interventions, ordering and review of laboratory studies, ordering and review of radiographic studies, pulse oximetry and re-evaluation of patient's condition. Labs Review Labs Reviewed  BASIC METABOLIC PANEL - Abnormal; Notable for the following:    Potassium 3.5 (*)    Glucose, Bld 148 (*)    GFR calc non Af Amer 67 (*)    GFR calc Af Amer 78 (*)    All other components within normal limits  CBC  HCG, SERUM, QUALITATIVE    Imaging Review Dg Chest Portable 1 View  02/06/2014   CLINICAL DATA:  Cough with shortness of breath.  EXAM: PORTABLE CHEST - 1 VIEW  COMPARISON:  11/2011.  FINDINGS: The heart size and mediastinal contours are within normal limits. Both lungs are clear. Mild dextroconvex scoliosis stable.  IMPRESSION: No active disease.  Stable exam.   Electronically Signed   By: Davonna BellingJohn  Curnes M.D.   On: 02/06/2014 17:28     EKG Interpretation None      MDM   Final diagnoses:  Asthma attack    I doubt any other EMC precluding discharge at this time including, but not necessarily limited to the following:anaphylaxis, SBI, resp failure.    Hurman HornJohn M Kaisey Huseby, MD 02/08/14 [phone]

## 2014-02-06 NOTE — ED Notes (Signed)
Patient requesting epipen.

## 2014-02-06 NOTE — ED Provider Notes (Signed)
Pt not in room. 1600  Hurman Horn, MD 02/06/14 [phone]

## 2015-06-16 IMAGING — CT CT MAXILLOFACIAL W/O CM
3 series · 16 of 37 positions shown, 19 images · non-contrast
Comparison: CT MAXILLOFACIAL LIMITED W/O dated 11/16/2010

CLINICAL DATA: Nasal congestion.  Polyps.  Fusion protocol.

EXAM:
CT MAXILLOFACIAL WITHOUT CONTRAST
TECHNIQUE: Multidetector CT imaging of the maxillofacial structures was
performed. Multiplanar CT image reconstructions were also generated.
A small metallic BB was placed on the right temple in order to
reliably differentiate right from left.

[Series 3: axial soft 1.25 · axial · 0.43mm/px · z∈[-9,+30]mm · 3 of 132 slices shown]
[im 16/132  brain]
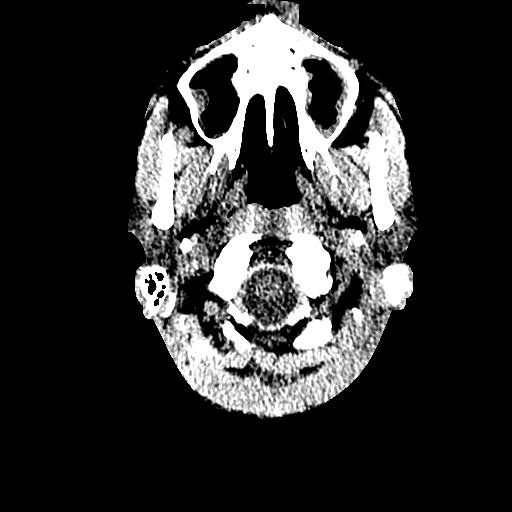
[im 31/132  brain]
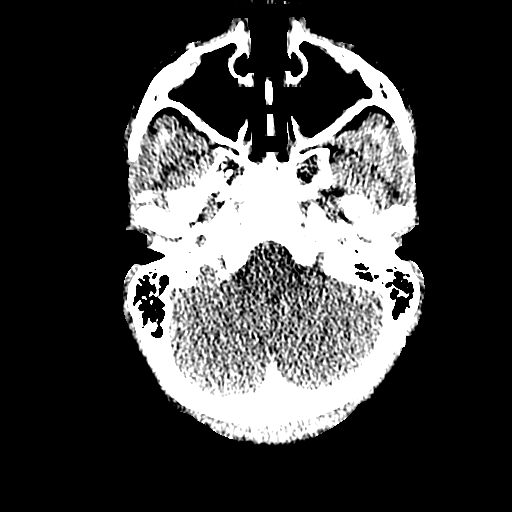
[im 47/132  brain]
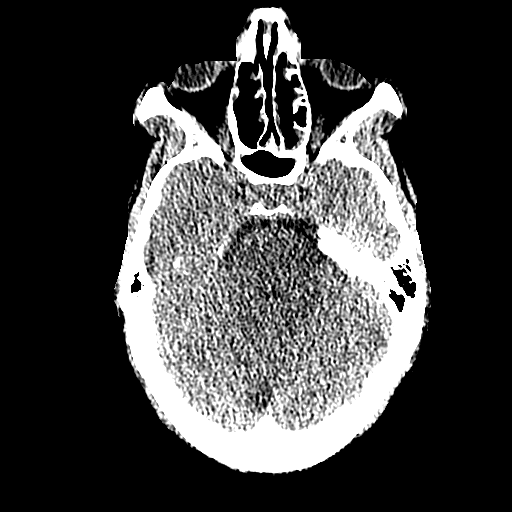

[Series 601: coronal facial · coronal · 0.43mm/px · 3 of 107 slices shown]
[im 36/107  bone]
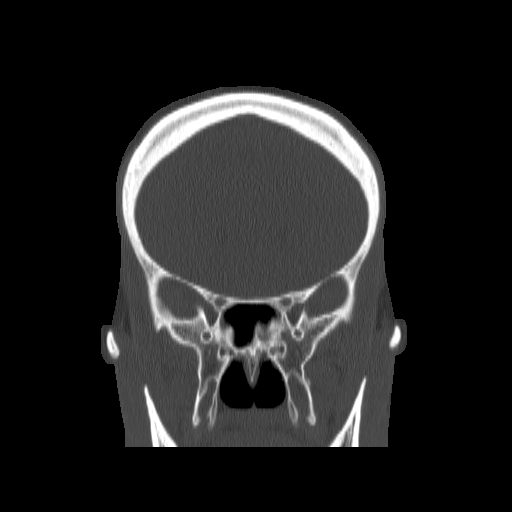
[im 54/107  bone]
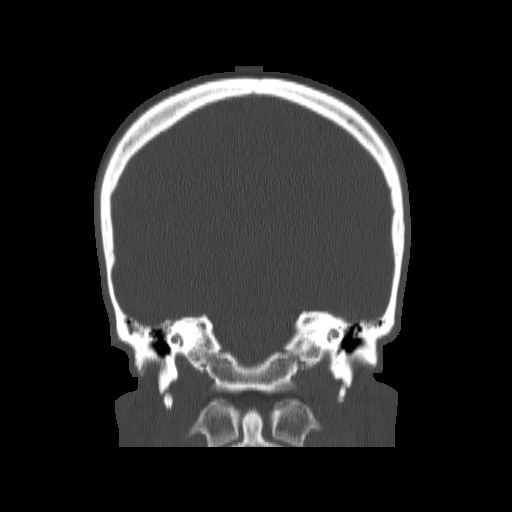
[im 71/107  bone]
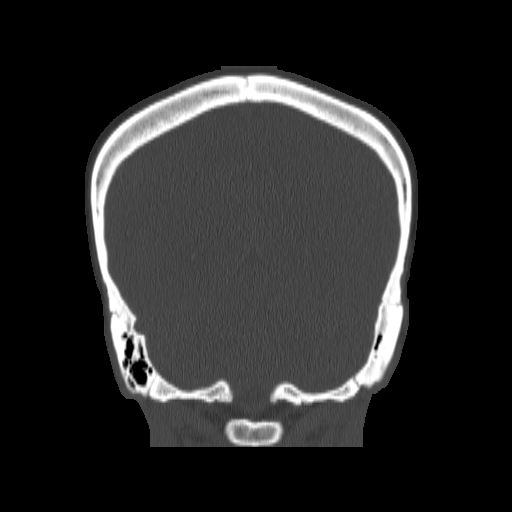

[Series 602: sagittal facial · sagittal · 0.43mm/px · 10 of 87 slices shown, 13 images]
[im 8/87  brain]
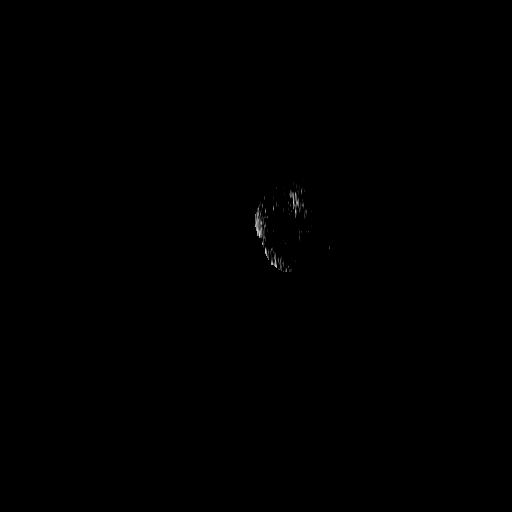
[im 8/87  bone]
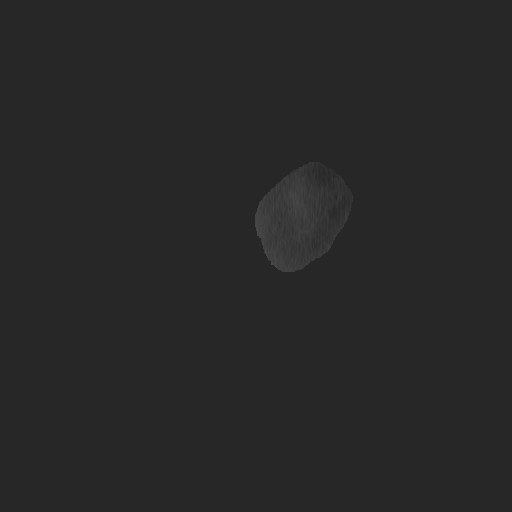
[im 16/87  bone]
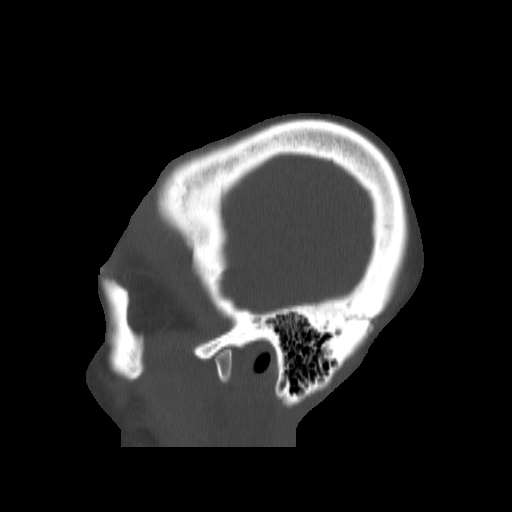
[im 24/87  bone]
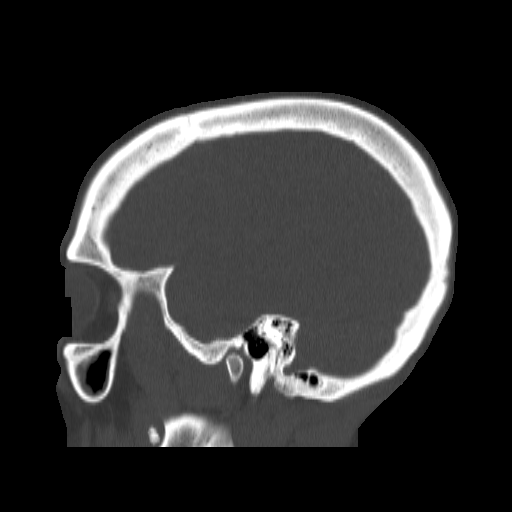
[im 32/87  bone]
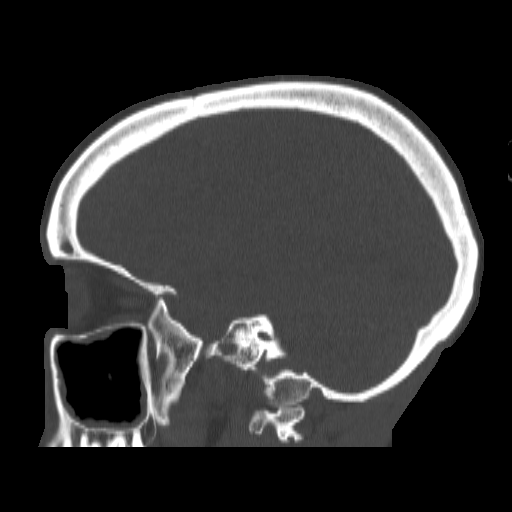
[im 40/87  brain]
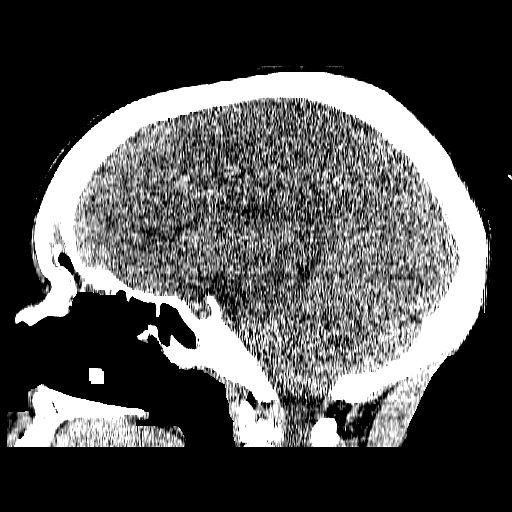
[im 40/87  bone]
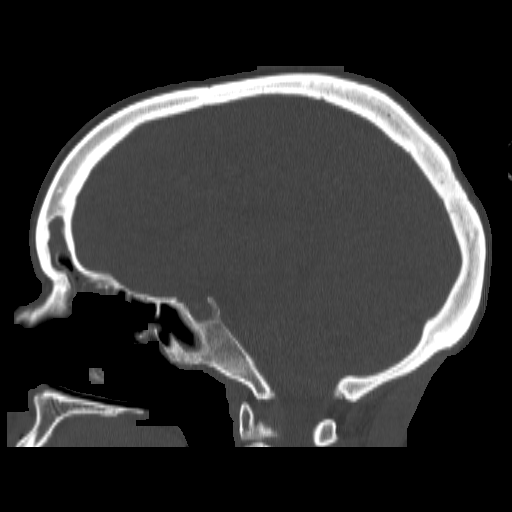
[im 47/87  bone]
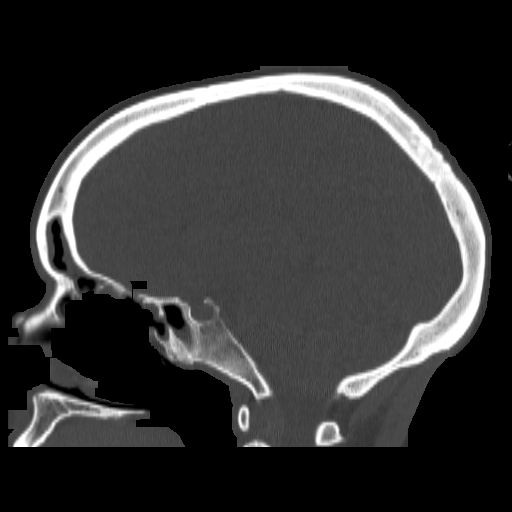
[im 55/87  bone]
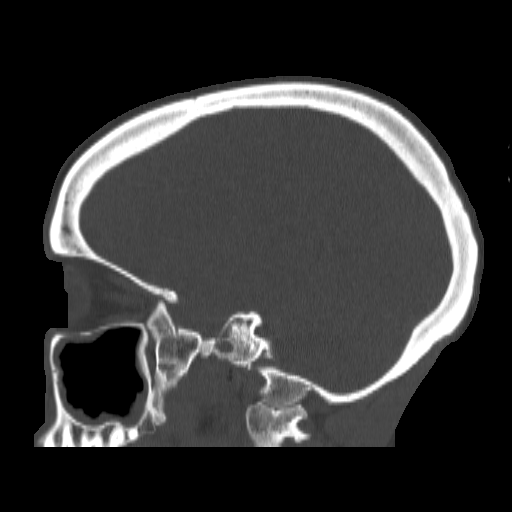
[im 63/87  bone]
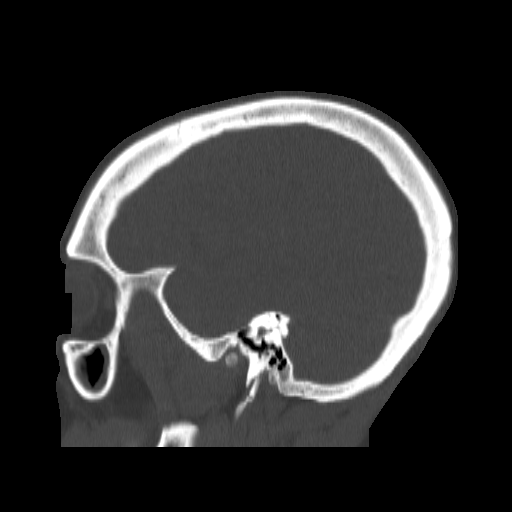
[im 71/87  brain]
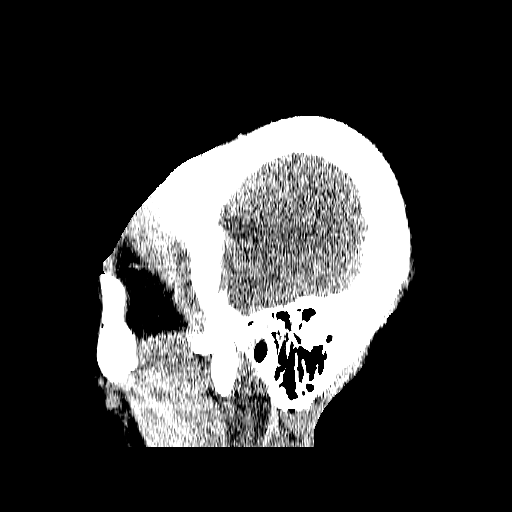
[im 71/87  bone]
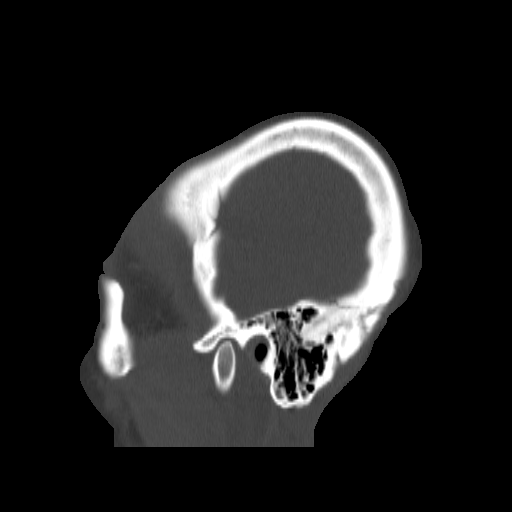
[im 79/87  bone]
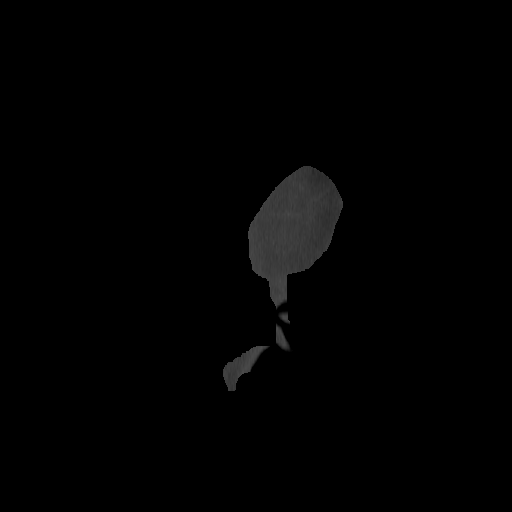

[16 of 37 positions shown; findings below may reference images not displayed]

FINDINGS: There is near complete opacification of the frontal sinuses, left
worse than right. Left frontal sinus aeration has mildly improved
from the prior study. Frontal recesses appear occluded.

Sequelae of interval partial bilateral ethmoidectomies and maxillary
antrostomies are identified. There is near complete opacification of
residual bilateral ethmoid air cells/mucosal thickening along the
superior aspects of the ethmoid cavities. The maxillary antrostomies
are patent but appear narrowed by circumferential bilateral
maxillary sinus mucosal thickening. Small mucous retention cyst or
polyp is present in the right maxillary sinus, much smaller than
that described on the prior CT. There is mild to moderate sphenoid
sinus mucosal thickening. No definite fluid levels are identified.
Mild thickening of the walls of the maxillary sinuses bilaterally is
consistent with chronic sinusitis. The nasal septum is midline.

There is trace left mastoid fluid. The orbits are unremarkable. The
visualized portion of the brain is grossly unremarkable.
IMPRESSION: 1. Interval maxillary antrostomies and partial ethmoidectomies.
Maxillary antrostomies appear patent. There is moderate mucosal
thickening in the ethmoid cavities.
2. Near complete opacification of the frontal sinuses. Left frontal
sinus aeration is mildly improved compared to the prior study.

## 2015-06-30 ENCOUNTER — Ambulatory Visit: Payer: Self-pay | Admitting: Allergy and Immunology

## 2015-07-04 ENCOUNTER — Telehealth: Payer: Self-pay | Admitting: *Deleted

## 2015-07-04 NOTE — Telephone Encounter (Signed)
Pharmacy called requesting patient wants to be [facility] because insurance does not pay for Symbicort and states it is not helping her. Advised pharmacist we would not switch patient's medication until she is seen in our office states patient stated she had one scheduled last week advised pt canceled that appt. Pharmacist understands we will not switch.

## 2015-07-24 ENCOUNTER — Ambulatory Visit: Payer: Self-pay | Admitting: Allergy and Immunology

## 2015-08-23 ENCOUNTER — Ambulatory Visit: Payer: Self-pay | Admitting: Allergy and Immunology

## 2016-05-15 ENCOUNTER — Ambulatory Visit: Payer: Medicaid Other | Admitting: Allergy and Immunology
